# Patient Record
Sex: Male | Born: 1995 | Race: White | Hispanic: No | Marital: Single | Smoking: Current every day smoker
Health system: Southern US, Community
[De-identification: ages and names within clinical notes are randomized; demographics above are authoritative.]

## PROBLEM LIST (undated history)

## (undated) DIAGNOSIS — F419 Anxiety disorder, unspecified: Secondary | ICD-10-CM

## (undated) HISTORY — DX: Anxiety disorder, unspecified: F41.9

---

## 2020-01-19 ENCOUNTER — Other Ambulatory Visit: Payer: Self-pay

## 2020-01-19 ENCOUNTER — Ambulatory Visit (HOSPITAL_COMMUNITY)
Admission: AD | Admit: 2020-01-19 | Discharge: 2020-01-19 | Disposition: A | Discharge status: Home / Self Care | Payer: Self-pay | Attending: Psychiatry | Admitting: Psychiatry

## 2020-01-19 DIAGNOSIS — F1594 Other stimulant use, unspecified with stimulant-induced mood disorder: Secondary | ICD-10-CM | POA: Insufficient documentation

## 2020-01-19 DIAGNOSIS — F329 Major depressive disorder, single episode, unspecified: Secondary | ICD-10-CM | POA: Insufficient documentation

## 2020-01-19 DIAGNOSIS — F1290 Cannabis use, unspecified, uncomplicated: Secondary | ICD-10-CM | POA: Insufficient documentation

## 2020-01-19 DIAGNOSIS — Z79899 Other long term (current) drug therapy: Secondary | ICD-10-CM | POA: Insufficient documentation

## 2020-01-19 DIAGNOSIS — Z1330 Encounter for screening examination for mental health and behavioral disorders, unspecified: Secondary | ICD-10-CM | POA: Insufficient documentation

## 2020-01-19 DIAGNOSIS — F17210 Nicotine dependence, cigarettes, uncomplicated: Secondary | ICD-10-CM | POA: Insufficient documentation

## 2020-01-19 DIAGNOSIS — F419 Anxiety disorder, unspecified: Secondary | ICD-10-CM | POA: Insufficient documentation

## 2020-01-19 DIAGNOSIS — Z20822 Contact with and (suspected) exposure to covid-19: Secondary | ICD-10-CM | POA: Insufficient documentation

## 2020-01-19 NOTE — BH Assessment (Addendum)
Assessment Note  Connor Avila is an 24 y.o. male, who presents voluntary and accompanied by his brother to [facility]. Clinician completed the pt assessment alone. Before engaging in the assessment, clinician observed the pt talking to registration, having questions about HIPPA and if he has to worry about staff. Clinician asked the pt, "what brought you to the hospital?" Pt reported, he feels he is suffering from Schizophrenia, he feels like people are responding to his thoughts. Pt reported, this has been going on since July 25, 2019. Pt reported, he can look over and see someone and they will respond to him verbally. Pt reported, he thought it was because of COVID that people are now conversing through the mind. Pt reported, he feels people are responding to him negatively and he feels discouraged. Pt reported, when he hears people, responding to his thoughts telling him to get out and of here, just leave, that makes him sad.  Pt reported, he has been homeless since he was 18. Pt denies, SI, HI, self-injurious behaviors and access to weapons.   Pt reported, smoking a gram of marijuana per day. Pt reported, smoking a half pack of cigarettes, daily. Pt reported, a history of drug use. Pt reported, he is not currently linked to OPT resources (medication management and/or counseling.) Pt reported, a previous inpatient admission when he was 17 at [facility].   Pt presents alert with logical, coherent speech. Pt's eye contact was good. Pt's mood, affect was pleasant. Pt's thought process was coherent, relevant. Pt's judgement was partial. Pt was oriented x4. Pt's concentration was normal. Pt's insight and impulse control was fair. Pt reported, if discharged from [facility] he could contract for safety.   Diagnosis: Unspecified Psychosis.   Past Medical History: No past medical history on file.  Family History: No family history on file.  Social History:  has no history on file for tobacco use,  alcohol use, and drug use.  Additional Social History:  Alcohol / Drug Use Pain Medications: See MAR Prescriptions: See MAR Over the Counter: See MAR History of alcohol / drug use?: Yes Substance #1 Name of Substance 1: Marijuana. 1 - Age of First Use: UTA 1 - Amount (size/oz): Pt reported, smoking a gram of marijuana per day. 1 - Frequency: Daily. 1 - Duration: Ongoing. 1 - Last Use / Amount: Pt reported, today. Substance #2 Name of Substance 2: Cigarettes. 2 - Age of First Use: UTA 2 - Amount (size/oz): Pt reported, smoking a half pack, daily. 2 - Frequency: Daily. 2 - Duration: Ongoing. 2 - Last Use / Amount: Daily.  CIWA:   COWS:    Allergies: Not on File  Home Medications: (Not in a hospital admission)   OB/GYN Status:  No LMP for male patient.  General Assessment Data Location of Assessment:  [facility]. ) TTS Assessment: In system Is this a Tele or Face-to-Face Assessment?: Face-to-Face Is this an Initial Assessment or a Re-assessment for this encounter?: Initial Assessment Patient Accompanied by:: Other Merleen Nicely, brother. ) Language Other than English: No Living Arrangements: Homeless/Shelter (Staying with brother. ) What gender do you identify as?: Male Marital status: Single Living Arrangements: Other (Comment) (Homeless but staying with brother.) Can pt return to current living arrangement?: Yes Admission Status: Voluntary Is patient capable of signing voluntary admission?: Yes Referral Source: Self/Family/Friend Insurance type: BCBS.  Medical Screening Exam [facility] Walk-in ONLY) Medical Exam completed: Yes  Crisis Care Plan Living Arrangements: Other (Comment) (Homeless but staying with  brother.) Legal Guardian: Other: (Self. ) Name of Psychiatrist: None.  Name of Therapist: None.   Education Status Is patient currently in school?: No  Risk to self with the past 6 months Suicidal Ideation: No (Pt denies. ) Has patient been a risk to self  within the past 6 months prior to admission? : No Suicidal Intent: No Has patient had any suicidal intent within the past 6 months prior to admission? : No Is patient at risk for suicide?: No Suicidal Plan?: No Has patient had any suicidal plan within the past 6 months prior to admission? : No Access to Means: No What has been your use of drugs/alcohol within the last 12 months?: Marijuana and cigarettes.  Previous Attempts/Gestures: No How many times?: 0 Other Self Harm Risks: NA Triggers for Past Attempts: None known Intentional Self Injurious Behavior: None (Pt denies. ) Recent stressful life event(s): Other (Comment) (Loud cars. ) Persecutory voices/beliefs?: No Depression: Yes Depression Symptoms: Despondent, Tearfulness Substance abuse history and/or treatment for substance abuse?: Yes Suicide prevention information given to non-admitted patients: Not applicable  Risk to Others within the past 6 months Homicidal Ideation: No (Pt denies. ) Does patient have any lifetime risk of violence toward others beyond the six months prior to admission? : No Thoughts of Harm to Others: No Current Homicidal Intent: No Current Homicidal Plan: No Access to Homicidal Means: No Identified Victim: NA History of harm to others?: No Assessment of Violence: None Noted Violent Behavior Description: NA Does patient have access to weapons?: No Criminal Charges Pending?: No Does patient have a court date: No Is patient on probation?: No  Psychosis Hallucinations: Auditory Delusions: None noted  Mental Status Report Appearance/Hygiene: Unremarkable Eye Contact: Good Motor Activity: Unremarkable Speech: Logical/coherent Level of Consciousness: Alert Anxiety Level: Minimal Thought Processes: Coherent, Relevant Judgement: Partial Orientation: Person, Place, Time, Situation Obsessive Compulsive Thoughts/Behaviors: None  Cognitive Functioning Concentration: Normal Memory: Recent  Intact Is patient IDD: No Insight: Fair Impulse Control: Fair Appetite: Poor Sleep: Decreased Total Hours of Sleep: 6 Vegetative Symptoms: None  ADLScreening [facility] Assessment Services) Patient's cognitive ability adequate to safely complete daily activities?: Yes Patient able to express need for assistance with ADLs?: Yes Independently performs ADLs?: Yes (appropriate for developmental age)  Prior Inpatient Therapy Prior Inpatient Therapy: Yes Prior Therapy Dates: When pt was 24 years old.  Prior Therapy Facilty/Provider(s): [facility].  Reason for Treatment: Suicidal thoughs.   Prior Outpatient Therapy Prior Outpatient Therapy: No Does patient have an ACCT team?: No Does patient have Intensive In-House Services?  : No Does patient have [facility] services? : No Does patient have P4CC services?: No  ADL Screening (condition at time of admission) Patient's cognitive ability adequate to safely complete daily activities?: Yes Is the patient deaf or have difficulty hearing?: No Does the patient have difficulty seeing, even when wearing glasses/contacts?: No Does the patient have difficulty concentrating, remembering, or making decisions?: No Patient able to express need for assistance with ADLs?: Yes Does the patient have difficulty dressing or bathing?: No Independently performs ADLs?: Yes (appropriate for developmental age) Does the patient have difficulty walking or climbing stairs?: No Weakness of Legs: None Weakness of Arms/Hands: None  Home Assistive Devices/Equipment Home Assistive Devices/Equipment: None    Abuse/Neglect Assessment (Assessment to be complete while patient is alone) Abuse/Neglect Assessment Can Be Completed: Yes Physical Abuse: Denies Verbal Abuse:  (Mental abuse.) Sexual Abuse: Denies Exploitation of patient/patient's resources: Denies Self-Neglect: Denies     Merchant navy officer (For Healthcare)  Does Patient Have a Medical Advance  Directive?: No    Disposition: Nira Conn, NP recommends pt is linked to continuing observation unit. Disposition discussed with Leanne Chang, Registration and Randa Evens, RN at [facility] that pt is coming.    Disposition Initial Assessment Completed for this Encounter: Yes Disposition of Patient: Admit Type of inpatient treatment program:  (Continuing observation unit. ) Patient refused recommended treatment: No  On Site Evaluation by: Redmond Pulling, MS, [facility], CRC.  Reviewed with Physician: Nira Conn, NP.  Redmond Pulling 01/19/2020 11:38 PM   Redmond Pulling, MS, [facility], [facility] Triage Specialist [phone]

## 2020-01-19 NOTE — H&P (Signed)
Behavioral Health Medical Screening Exam  Connor Avila is an 24 y.o. male.  Transferred to [facility] for continuous assessment.  Total Time spent with patient: 15 minutes  Psychiatric Specialty Exam: Physical Exam Constitutional:      General: He is not in acute distress.    Appearance: He is not ill-appearing, toxic-appearing or diaphoretic.  Pulmonary:     Effort: Pulmonary effort is normal.  Musculoskeletal:        General: Normal range of motion.  Skin:    General: Skin is warm and dry.  Neurological:     Mental Status: He is alert and oriented to person, place, and time.    Review of Systems  Constitutional: Negative for activity change, appetite change, chills, diaphoresis, fatigue, fever and unexpected weight change.  HENT: Negative for congestion and sore throat.   Respiratory: Negative for cough.   Cardiovascular: Negative for chest pain.  Gastrointestinal: Negative for diarrhea, nausea and vomiting.  Neurological: Negative for dizziness and seizures.  Psychiatric/Behavioral: Positive for dysphoric mood. The patient is nervous/anxious.    There were no vitals taken for this visit.There is no height or weight on file to calculate BMI. General Appearance: Casual Eye Contact:  Good Speech:  Clear and Coherent and Normal Rate Volume:  Normal Mood:  Anxious and Depressed Affect:  Congruent Thought Process:  Coherent, Goal Directed and Linear Orientation:  Full (Time, Place, and Person) Thought Content:  Paranoid Ideation Suicidal Thoughts:  No Homicidal Thoughts:  No Memory:  Immediate;   Good Recent;   Good Judgement:  Fair Insight:  Fair Psychomotor Activity:  Normal Concentration: Concentration: Good and Attention Span: Good Recall:  Good Fund of Knowledge:Good Language: Good Akathisia:  Negative Handed:  Right AIMS (if indicated):    Assets:  Communication Skills Desire for Improvement Leisure Time Physical Health Resilience Sleep:      Musculoskeletal: Strength & Muscle Tone: within normal limits Gait & Station: normal Patient leans: N/A  There were no vitals taken for this visit.  Recommendations: Based on my evaluation the patient does not appear to have an emergency medical condition.  Jackelyn Poling, NP 01/19/2020, 11:40 PM

## 2020-01-20 ENCOUNTER — Other Ambulatory Visit: Payer: Self-pay

## 2020-01-20 ENCOUNTER — Encounter (HOSPITAL_COMMUNITY): Payer: Self-pay

## 2020-01-20 ENCOUNTER — Ambulatory Visit (HOSPITAL_COMMUNITY)
Admission: EM | Admit: 2020-01-20 | Discharge: 2020-01-20 | Disposition: A | Discharge status: Home / Self Care | Payer: No Payment, Other | Attending: Nurse Practitioner | Admitting: Nurse Practitioner

## 2020-01-20 DIAGNOSIS — F1594 Other stimulant use, unspecified with stimulant-induced mood disorder: Secondary | ICD-10-CM

## 2020-01-20 LAB — TSH: TSH: 1.252 u[IU]/mL (ref 0.350–4.500)

## 2020-01-20 LAB — CBC WITH DIFFERENTIAL/PLATELET
Abs Immature Granulocytes: 0.01 10*3/uL (ref 0.00–0.07)
Basophils Absolute: 0 10*3/uL (ref 0.0–0.1)
Basophils Relative: 0 %
Eosinophils Absolute: 0.2 10*3/uL (ref 0.0–0.5)
Eosinophils Relative: 2 %
HCT: 45.7 % (ref 39.0–52.0)
Hemoglobin: 16.1 g/dL (ref 13.0–17.0)
Immature Granulocytes: 0 %
Lymphocytes Relative: 35 %
Lymphs Abs: 2.5 10*3/uL (ref 0.7–4.0)
MCH: 30.6 pg (ref 26.0–34.0)
MCHC: 35.2 g/dL (ref 30.0–36.0)
MCV: 86.7 fL (ref 80.0–100.0)
Monocytes Absolute: 0.9 10*3/uL (ref 0.1–1.0)
Monocytes Relative: 12 %
Neutro Abs: 3.7 10*3/uL (ref 1.7–7.7)
Neutrophils Relative %: 51 %
Platelets: 163 10*3/uL (ref 150–400)
RBC: 5.27 MIL/uL (ref 4.22–5.81)
RDW: 12.2 % (ref 11.5–15.5)
WBC: 7.3 10*3/uL (ref 4.0–10.5)
nRBC: 0 % (ref 0.0–0.2)

## 2020-01-20 LAB — COMPREHENSIVE METABOLIC PANEL
ALT: 22 U/L (ref 0–44)
AST: 26 U/L (ref 15–41)
Albumin: 4.6 g/dL (ref 3.5–5.0)
Alkaline Phosphatase: 57 U/L (ref 38–126)
Anion gap: 13 (ref 5–15)
BUN: 22 mg/dL — ABNORMAL HIGH (ref 6–20)
CO2: 22 mmol/L (ref 22–32)
Calcium: 9.2 mg/dL (ref 8.9–10.3)
Chloride: 99 mmol/L (ref 98–111)
Creatinine, Ser: 1.3 mg/dL — ABNORMAL HIGH (ref 0.61–1.24)
GFR calc Af Amer: >60 mL/min (ref >60)
GFR calc non Af Amer: >60 mL/min (ref >60)
Glucose, Bld: 113 mg/dL — ABNORMAL HIGH (ref 70–99)
Potassium: 3.6 mmol/L (ref 3.5–5.1)
Sodium: 134 mmol/L — ABNORMAL LOW (ref 135–145)
Total Bilirubin: 1.4 mg/dL — ABNORMAL HIGH (ref 0.3–1.2)
Total Protein: 6.7 g/dL (ref 6.5–8.1)

## 2020-01-20 LAB — HEMOGLOBIN A1C
Hgb A1c MFr Bld: 5.4 % (ref 4.8–5.6)
Mean Plasma Glucose: 108.28 mg/dL

## 2020-01-20 LAB — POCT URINE DRUG SCREEN - MANUAL ENTRY (I-SCREEN)
POC Amphetamine UR: POSITIVE — AB
POC Buprenorphine (BUP): NOT DETECTED
POC Cocaine UR: NOT DETECTED
POC Marijuana UR: POSITIVE — AB
POC Methadone UR: NOT DETECTED
POC Methamphetamine UR: POSITIVE — AB
POC Morphine: NOT DETECTED
POC Oxazepam (BZO): NOT DETECTED
POC Oxycodone UR: NOT DETECTED
POC Secobarbital (BAR): NOT DETECTED

## 2020-01-20 LAB — LIPID PANEL
Cholesterol: 106 mg/dL (ref 0–200)
HDL: 37 mg/dL — ABNORMAL LOW (ref >40)
LDL Cholesterol: 54 mg/dL (ref 0–99)
Total CHOL/HDL Ratio: 2.9 RATIO
Triglycerides: 74 mg/dL (ref <150)
VLDL: 15 mg/dL (ref 0–40)

## 2020-01-20 LAB — POC SARS CORONAVIRUS 2 AG: SARS Coronavirus 2 Ag: NEGATIVE

## 2020-01-20 MED ORDER — ACETAMINOPHEN 325 MG PO TABS: 650.0000 mg | ORAL_TABLET | Freq: Four times a day (QID) | ORAL | Status: DC | PRN

## 2020-01-20 MED ORDER — MAGNESIUM HYDROXIDE 400 MG/5ML PO SUSP: 30.0000 mL | Freq: Every day | ORAL | Status: DC | PRN

## 2020-01-20 MED ORDER — HYDROXYZINE HCL 25 MG PO TABS: 25.0000 mg | ORAL_TABLET | Freq: Three times a day (TID) | ORAL | 0 refills | Status: AC | PRN

## 2020-01-20 MED ORDER — QUETIAPINE FUMARATE 100 MG PO TABS
100.0000 mg | ORAL_TABLET | Freq: Every day | ORAL | Status: DC
  Administered 2020-01-20: 100 mg via ORAL
  Filled 2020-01-20: qty 1

## 2020-01-20 MED ORDER — QUETIAPINE FUMARATE 100 MG PO TABS: 100.0000 mg | ORAL_TABLET | Freq: Every day | ORAL | 0 refills | Status: AC

## 2020-01-20 MED ORDER — TRAZODONE HCL 50 MG PO TABS: 50.0000 mg | ORAL_TABLET | Freq: Every evening | ORAL | Status: DC | PRN

## 2020-01-20 MED ORDER — ALUM & MAG HYDROXIDE-SIMETH 200-200-20 MG/5ML PO SUSP: 30.0000 mL | ORAL | Status: DC | PRN

## 2020-01-20 MED ORDER — HYDROXYZINE HCL 25 MG PO TABS
25.0000 mg | ORAL_TABLET | Freq: Three times a day (TID) | ORAL | Status: DC | PRN
  Administered 2020-01-20: 25 mg via ORAL
  Filled 2020-01-20: qty 1

## 2020-01-20 NOTE — Discharge Instructions (Addendum)
Substance abuse resources and Residential Options:  [facility] day residential substance abuse facility (not an option if you have active assault charges). [address], [place], [place] [address] Phone: [phone]: Ask for Shayla in admissions to complete intake if interested in pursuing this option.  [facility] (For uninsured/[place] Medicaid patients only): Can get intake scheduled; (not an option if you have active assault charges). [address] [place], [place] ([phone]) Call Mon-Fri.  [facility]): (offers outpatient therapy and intensive outpatient substance abuse therapy).  [address], [place], [place] [address] Phone: [phone]  [facility]: Offers FREE recovery skills classes, support groups, 1:1 Peer Support, and Compeer Classes. [address], [place], [place] [address] Phone: [phone] (Call to complete intake).   [facility] [address] [place], [place] [address] Phone: [phone] ext [phone]  [facility] provides food, shelter and other programs and services to the homeless men of [place] through our men's program.  By offering safe shelter, three meals a day, clean clothing, Biblical counseling, financial planning, vocational training, GED/education and employment assistance, we've helped mend the shattered lives of many homeless men since opening in 1974.  We have approximately 267 beds available, with a max of 312 beds including mats for emergency situations and currently house an average of 270 men a night.  Prospective Client Check-In Information Photo ID Required (State/ Out of State/ DOC) - if photo ID is not available, clients are required to have a printout of a police/sheriff's criminal history report. Help out with chores around the Mission. No sex offender of any type (pending, charged, registered and/or any other sex  related offenses) will be permitted to check in. Must be willing to abide by all rules, regulations, and policies established by the [facility]. The following will be provided - shelter, food, clothing, and biblical counseling. If you or someone you know is in need of assistance at our men's shelter in [place], [place], please call [phone] ext. [phone].   [facility] Information:   [facility]: Call Tee Gray for information/admission: [phone] Recovery homes are located in the [place] area/on the bus line. There is an admission fee and weekly rent fee. Please speak with Mr. Gray regarding the specifics.  [facility]:   For house vacancies and contacts, please visit: www.[email or web address].com  Basic House Rules   Houses operate democratically, electing house officers who serve six-month terms.   Houses are financially self-supporting; members split house expenses, which average  $110.00 to $140.00 per person per week.   Any Resident who has a recurrence of use of alcohol and/or illicit drugs must be immediately expelled.   [facility] [address] , [place] [address] PHONE: [phone] Hours: Monday-Friday 8:00AM-3:00PM  This agency offers the following services for free:   *Integrated Care -Case management -[address] Outreach -Medical clinic -Mental health nurse -Referrals  *Fundamental Services -Showers and hygiene supplies -Laundry -Phone access -Mailing addresses and mailboxes -Replacement IDs -Onsite barbershop -Storage lockers -[place] winter warming center  *Self Sufficiency -Skilled trade classes -Job skills classes -Resume and jobs application assistance -Interview training -GED classes -Professional clothing vouchers -Financial literacy  Take all of your medications as prescribed by your mental healthcare provider.   Report any adverse effects and reactions from your medications to your outpatient provider promptly.  Do not engage in alcohol and or   illegal drug use while on prescription medicines. Keep all scheduled appointments. This is to ensure that you are getting refills on time and to avoid any interruption in your medication.  If you are unable to keep an appointment call to reschedule.  Be sure to follow up with resources and follow ups given. In the event of worsening symptoms call the crisis hotline, 911, and or go to the nearest emergency department for appropriate evaluation and treatment of symptoms. Follow-up with your primary care provider for your medical issues, concerns and or health care needs.   

## 2020-01-20 NOTE — ED Provider Notes (Signed)
Behavioral Health Admission H&P [facility] & OBS)  Date: 01/20/20 Patient Name: Connor Avila MRN: [id number] Chief Complaint:  Chief Complaint  Patient presents with  . Anxiety      Diagnoses:  Final diagnoses:  Methamphetamine-induced mood disorder (HCC)    HPI: Mikey Maffett is an 24 y.o. male who presented voluntarily to [facility] as a walk-in.  Patient reported feelings of paranoia.  Reported that he feels like people are hearing and responding to his thoughts.  Stated that he feels like people are talking negatively about him.  He was transferred to behavioral health urgent care for continuous assessment.  Upon arriving to [facility], patient asked why is he being held here against his will.  Discussed with him that he is here voluntarily.  He requested to leave.  He denied SI, HI, and AVH; did not meet IVC criteria; and was told that he could discharge with outpatient resources. However, before leaving the building he changed his mind.  He apologized for being "difficult".  On evaluation, patient is alert and oriented x4.  He is irritable, but cooperative.  His speech is clear and coherent.  He reports feelings of paranoia and irritability. Reports that he feels like people can hear his thoughts and are responding negatively.  Patient denies auditory and visual hallucinations.  He does report that he had auditory and visual hallucinations on January 19 of this year after smoking marijuana.  He does not appear to be responding to internal stimuli.  He reports that he uses marijuana daily.  He reports that he smokes methamphetamine, reports last use was about a week ago.  Denies use of other substances.  Urine drug screen was positive for methamphetamine, amphetamines, and marijuana.    Patient initially declined to have any blood drawn.  He stated "I want my blood to stay inside of me."   Patient reported that the only medications he is taken in the past is hydroxyzine and propranolol, both for anxiety.   When discussing the possibility of starting Seroquel for paranoia and irritability, patient stated that he has taken Seroquel 300 mg nightly in the past.  He states that he tolerated Seroquel well other than daytime drowsiness.  He is in agreement with starting Seroquel at a lower dose.   PHQ 2-9:     Total Time spent with patient: 45 minutes  Musculoskeletal  Strength & Muscle Tone: within normal limits Gait & Station: normal Patient leans: N/A  Psychiatric Specialty Exam  Presentation General Appearance: Appropriate for Environment;Casual  Eye Contact:Good  Speech:Clear and Coherent;Normal Rate  Speech Volume:Normal  Handedness:No data recorded  Mood and Affect  Mood:Irritable  Affect:Congruent   Thought Process  Thought Processes:Coherent;Linear  Descriptions of Associations:Intact  Orientation:Full (Time, Place and Person)  Thought Content:Paranoid Ideation  Hallucinations:Hallucinations: None  Ideas of Reference:None  Suicidal Thoughts:Suicidal Thoughts: No  Homicidal Thoughts:Homicidal Thoughts: No   Sensorium  Memory:Immediate Good;Recent Good  Judgment:Fair  Insight:Fair   Executive Functions  Concentration:Good  Attention Span:Good  Recall:Good  Fund of Knowledge:Good  Language:Good   Psychomotor Activity  Psychomotor Activity:Psychomotor Activity: Normal   Assets  Assets:Communication Skills;Desire for Improvement;Housing;Leisure Time;Physical Health;Resilience   Sleep  Sleep:Sleep: Fair   Physical Exam Vitals and nursing note reviewed.  Constitutional:      General: He is not in acute distress.    Appearance: He is not ill-appearing, toxic-appearing or diaphoretic.  HENT:     Head: Normocephalic.     Right Ear: External ear normal.  Left Ear: External ear normal.  Eyes:     Pupils: Pupils are equal, round, and reactive to light.  Cardiovascular:     Rate and Rhythm: Normal rate.  Pulmonary:     Effort:  Pulmonary effort is normal. No respiratory distress.  Neurological:     Mental Status: He is alert and oriented to person, place, and time.  Psychiatric:        Mood and Affect: Mood is anxious and depressed.        Thought Content: Thought content is paranoid. Thought content does not include homicidal or suicidal ideation.    Review of Systems  Constitutional: Negative for chills, diaphoresis, fever, malaise/fatigue and weight loss.  Respiratory: Negative for cough and shortness of breath.   Cardiovascular: Negative for chest pain.  Gastrointestinal: Negative for diarrhea, nausea and vomiting.  Neurological: Negative for dizziness and seizures.  Psychiatric/Behavioral: Positive for depression and substance abuse. Negative for hallucinations, memory loss and suicidal ideas. The patient is nervous/anxious and has insomnia.     Blood pressure 114/82, pulse 60, temperature (!) 97.5 F (36.4 C), temperature source Tympanic, resp. rate 18, height  (2.032 m), weight 180 lb (81.6 kg), SpO2 100 %. Body mass index is 19.77 kg/m.  Past Psychiatric History: Methamphetamine Use Disorder, Marijuana Use. Reports inpatient admission at the age of 89 at [facility] due to depression.  Is the patient at risk to self? No  Has the patient been a risk to self in the past 6 months? No .    Has the patient been a risk to self within the distant past? No   Is the patient a risk to others? No   Has the patient been a risk to others in the past 6 months? No   Has the patient been a risk to others within the distant past? No   Past Medical History:  Past Medical History:  Diagnosis Date  . Anxiety    History reviewed. No pertinent surgical history.  Family History: No family history on file.  Social History:  Social History   Socioeconomic History  . Marital status: Single    Spouse name: Not on file  . Number of children: Not on file  . Years of education: Not on file  . Highest education  level: Not on file  Occupational History  . Not on file  Tobacco Use  . Smoking status: Current Every Day Smoker    Packs/day: 0.50  Substance and Sexual Activity  . Alcohol use: Not on file  . Drug use: Yes    Types: Amphetamines, Marijuana, Methamphetamines    Comment: daily  . Sexual activity: Not Currently  Other Topics Concern  . Not on file  Social History Narrative  . Not on file   Social Determinants of Health   Financial Resource Strain:   . Difficulty of Paying Living Expenses:   Food Insecurity:   . Worried About Programme researcher, broadcasting/film/video in the Last Year:   . Barista in the Last Year:   Transportation Needs:   . Freight forwarder (Medical):   Marland Kitchen Lack of Transportation (Non-Medical):   Physical Activity:   . Days of Exercise per Week:   . Minutes of Exercise per Session:   Stress:   . Feeling of Stress :   Social Connections:   . Frequency of Communication with Friends and Family:   . Frequency of Social Gatherings with Friends and Family:   . Attends  Religious Services:   . Active Member of Clubs or Organizations:   . Attends BankerClub or Organization Meetings:   Marland Kitchen. Marital Status:   Intimate Partner Violence:   . Fear of Current or Ex-Partner:   . Emotionally Abused:   Marland Kitchen. Physically Abused:   . Sexually Abused:     SDOH:  SDOH Screenings   Alcohol Screen:   . Last Alcohol Screening Score (AUDIT):   Depression (PHQ2-9):   . PHQ-2 Score:   Financial Resource Strain:   . Difficulty of Paying Living Expenses:   Food Insecurity:   . Worried About Programme researcher, broadcasting/film/videounning Out of Food in the Last Year:   . [employer] Out of Food in the Last Year:   Housing:   . Last Housing Risk Score:   Physical Activity:   . Days of Exercise per Week:   . Minutes of Exercise per Session:   Social Connections:   . Frequency of Communication with Friends and Family:   . Frequency of Social Gatherings with Friends and Family:   . Attends Religious Services:   . Active Member of Clubs or  Organizations:   . Attends BankerClub or Organization Meetings:   Marland Kitchen. Marital Status:   Stress:   . Feeling of Stress :   Tobacco Use: High Risk  . Smoking Tobacco Use: Current Every Day Smoker  . Smokeless Tobacco Use: Unknown  Transportation Needs:   . Lack of Transportation (Medical):   Marland Kitchen. Lack of Transportation (Non-Medical):     Last Labs:  Admission on 01/20/2020  Component Date Value Ref Range Status  . SARS Coronavirus 2 Ag 01/20/2020 NEGATIVE  NEGATIVE Final   Comment: (NOTE) SARS-CoV-2 antigen NOT DETECTED.   Negative results are presumptive.  Negative results do not preclude SARS-CoV-2 infection and should not be used as the sole basis for treatment or other patient management decisions, including infection  control decisions, particularly in the presence of clinical signs and  symptoms consistent with COVID-19, or in those who have been in contact with the virus.  Negative results must be combined with clinical observations, patient history, and epidemiological information. The expected result is Negative.  Fact Sheet for Patients: https://sanders-williams.net/https://www.fda.gov/media/139754/download  Fact Sheet for Healthcare Providers: https://martinez.com/https://www.fda.gov/media/139753/download   This test is not yet approved or cleared by the [place] FDA and  has been authorized for detection and/or diagnosis of SARS-CoV-2 by FDA under an Emergency Use Authorization (EUA).  This EUA will remain in effect (meaning this test can be used) for the duration of  the C                          OVID-19 declaration under Section 564(b)(1) of the Act, 21 U.S.C. section 360bbb-3(b)(1), unless the authorization is terminated or revoked sooner.    . WBC 01/20/2020 7.3  4.0 - 10.5 K/uL Final  . RBC 01/20/2020 5.27  4.22 - 5.81 MIL/uL Final  . Hemoglobin 01/20/2020 16.1  13.0 - 17.0 g/dL Final  . HCT 16/10/960407/17/2021 45.7  39 - 52 % Final  . MCV 01/20/2020 86.7  80.0 - 100.0 fL Final  . MCH 01/20/2020 30.6  26.0 - 34.0  pg Final  . MCHC 01/20/2020 35.2  30.0 - 36.0 g/dL Final  . RDW 54/09/811907/17/2021 12.2  11.5 - 15.5 % Final  . Platelets 01/20/2020 163  150 - 400 K/uL Final  . nRBC 01/20/2020 0.0  0.0 - 0.2 % Final  . Neutrophils Relative % 01/20/2020 51  %  Final  . Neutro Abs 01/20/2020 3.7  1.7 - 7.7 K/uL Final  . Lymphocytes Relative 01/20/2020 35  % Final  . Lymphs Abs 01/20/2020 2.5  0.7 - 4.0 K/uL Final  . Monocytes Relative 01/20/2020 12  % Final  . Monocytes Absolute 01/20/2020 0.9  0 - 1 K/uL Final  . Eosinophils Relative 01/20/2020 2  % Final  . Eosinophils Absolute 01/20/2020 0.2  0 - 0 K/uL Final  . Basophils Relative 01/20/2020 0  % Final  . Basophils Absolute 01/20/2020 0.0  0 - 0 K/uL Final  . Immature Granulocytes 01/20/2020 0  % Final  . Abs Immature Granulocytes 01/20/2020 0.01  0.00 - 0.07 K/uL Final   Performed at [facility], [address] [place], [place] [address]  . Sodium 01/20/2020 134* 135 - 145 mmol/L Final  . Potassium 01/20/2020 3.6  3.5 - 5.1 mmol/L Final  . Chloride 01/20/2020 99  98 - 111 mmol/L Final  . CO2 01/20/2020 22  22 - 32 mmol/L Final  . Glucose, Bld 01/20/2020 113* 70 - 99 mg/dL Final   Glucose reference range applies only to samples taken after fasting for at least 8 hours.  . BUN 01/20/2020 22* 6 - 20 mg/dL Final  . Creatinine, Ser 01/20/2020 1.30* 0.61 - 1.24 mg/dL Final  . Calcium 09/62/8366 9.2  8.9 - 10.3 mg/dL Final  . Total Protein 01/20/2020 6.7  6.5 - 8.1 g/dL Final  . Albumin 29/47/6546 4.6  3.5 - 5.0 g/dL Final  . AST 50/35/4656 26  15 - 41 U/L Final  . ALT 01/20/2020 22  0 - 44 U/L Final  . Alkaline Phosphatase 01/20/2020 57  38 - 126 U/L Final  . Total Bilirubin 01/20/2020 1.4* 0.3 - 1.2 mg/dL Final  . GFR calc non Af Amer 01/20/2020 >60  >60 mL/min Final  . GFR calc Af Amer 01/20/2020 >60  >60 mL/min Final  . Anion gap 01/20/2020 13  5 - 15 Final   Performed at [facility], [address] [place], [place] [address]  . Hgb A1c  MFr Bld 01/20/2020 5.4  4.8 - 5.6 % Final   Comment: (NOTE) Pre diabetes:          5.7%-6.4%  Diabetes:              >6.4%  Glycemic control for   <7.0% adults with diabetes   . Mean Plasma Glucose 01/20/2020 108.28  mg/dL Final   Performed at [facility], [address], [place], [place] [address]  . Cholesterol 01/20/2020 106  0 - 200 mg/dL Final  . Triglycerides 01/20/2020 74  <150 mg/dL Final  . HDL 74/94/4967 37* >40 mg/dL Final  . Total CHOL/HDL Ratio 01/20/2020 2.9  RATIO Final  . VLDL 01/20/2020 15  0 - 40 mg/dL Final  . LDL Cholesterol 01/20/2020 54  0 - 99 mg/dL Final   Comment:        Total Cholesterol/HDL:CHD Risk Coronary Heart Disease Risk Table                     Men   Women  1/2 Average Risk   3.4   3.3  Average Risk       5.0   4.4  2 X Average Risk   9.6   7.1  3 X Average Risk  23.4   11.0        Use the calculated Patient Ratio above and  the CHD Risk Table to determine the patient's CHD Risk.        ATP III CLASSIFICATION (LDL):  <100     mg/dL   Optimal  160-737  mg/dL   Near or Above                    Optimal  130-159  mg/dL   Borderline  106-269  mg/dL   High  >485     mg/dL   Very High Performed at [facility], [address] [place], [place] [address]   . TSH 01/20/2020 1.252  0.350 - 4.500 uIU/mL Final   Comment: Performed by a 3rd Generation assay with a functional sensitivity of <=0.01 uIU/mL. Performed at [facility], [address], [place], [place] [address]   . POC Amphetamine UR 01/20/2020 Positive* None Detected Final  . POC Secobarbital (BAR) 01/20/2020 None Detected  None Detected Final  . POC Buprenorphine (BUP) 01/20/2020 None Detected  None Detected Final  . POC Oxazepam (BZO) 01/20/2020 None Detected  None Detected Final  . POC Cocaine UR 01/20/2020 None Detected  None Detected Final  . POC Methamphetamine UR 01/20/2020 Positive* None Detected Final  . POC Morphine 01/20/2020 None Detected  None Detected  Final  . POC Oxycodone UR 01/20/2020 None Detected  None Detected Final  . POC Methadone UR 01/20/2020 None Detected  None Detected Final  . POC Marijuana UR 01/20/2020 Positive* None Detected Final    Allergies: Patient has no allergy information on record.  PTA Medications: (Not in a hospital admission)   Medical Decision Making  Sodium 134, BUN 22, creatinine 1.30.  Patient is likely dehydrated.  Encouraged to drink Gatorade and fluids. QTC-427 Lipids, TSH, A1c, and CBC are essentially normal.  Start Seroquel 100 mg QHS for mood stability, paranoia Hydroxyzine 25 mg TID prn for anxiety    Recommendations  Based on my evaluation the patient does not appear to have an emergency medical condition.   Patient will be placed in the continuous assessment area at [facility] for treatment and stabilization. He will be reevaluated on 01/20/2020. The treatment team will determine disposition at that time.    Jackelyn Poling, NP 01/20/20  5:17 AM

## 2020-01-20 NOTE — ED Provider Notes (Signed)
FBC/OBS ASAP Discharge Summary  Date and Time: 01/20/2020 10:57 AM  Name: Connor Avila  MRN:  [id number]   Discharge Diagnoses:  Final diagnoses:  Methamphetamine-induced mood disorder (HCC)    Subjective: Patient states "I just want to find a halfway house to make money at in order to get ahead in life, I need the proper resources to do so."  Stay Summary: Patient assessed by nurse practitioner.  Patient alert and oriented, participates in assessment with irritable affect. Patient presented for voluntary walk-in assessment around midnight.  Currently patient admits to polysubstance use disorder including, marijuana, cocaine, methamphetamines, LSD and mushrooms.  Patient states "I have a substance abuse problem."  Patient reports "last substance use treatment approximately 2 years ago at [facility] where he completed 19 days. Patient reports "a condition where people are hearing my thoughts since July 25, 2019."  Patient believes this may have been triggered by marijuana use in January.  Patient denies command hallucinations. Patient denies suicidal and homicidal ideations.  Patient denies self-harm behaviors, denies history of suicide attempts.  Patient denies auditory visual hallucinations.  Patient denies symptoms of paranoia.  Patient does not appear to be responding to internal stimuli. Patient reports he is currently homeless in [place].  Patient reports most recently "staying in my car since I came to [place] from [place] in March 2021."  Patient reports residing in an [place] house in [place] prior to relocating to [facility].  Patient reports he has family and friends in the [place] area. Patient denies access to weapons.  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  Patient reports he has a history of depression and anxiety.  Patient denies current outpatient psychiatric provider, denies any history of medications to address mood.   Patient reports he would be willing to follow-up with outpatient psychiatry here at [facility].  Patient reports concerns that he is currently uninsured.  Patient offered support and encouragement. Patient denies any person to reach for collateral information.   Total Time spent with patient: 30 minutes  Past Psychiatric History: Anxiety, depression, polysubstance use disorder Past Medical History:  Past Medical History:  Diagnosis Date  . Anxiety    History reviewed. No pertinent surgical history. Family History: History reviewed. No pertinent family history. Family Psychiatric History: Mother-anxiety, depression Social History:  Social History   Substance and Sexual Activity  Alcohol Use None     Social History   Substance and Sexual Activity  Drug Use Yes  . Types: Amphetamines, Marijuana, Methamphetamines   Comment: daily    Social History   Socioeconomic History  . Marital status: Single    Spouse name: Not on file  . Number of children: Not on file  . Years of education: Not on file  . Highest education level: Not on file  Occupational History  . Not on file  Tobacco Use  . Smoking status: Current Every Day Smoker    Packs/day: 0.50    Types: Cigarettes  . Smokeless tobacco: Never Used  Substance and Sexual Activity  . Alcohol use: Not on file  . Drug use: Yes    Types: Amphetamines, Marijuana, Methamphetamines    Comment: daily  . Sexual activity: Not Currently  Other Topics Concern  . Not on file  Social History Narrative  . Not on file   Social Determinants of Health   Financial Resource Strain:   . Difficulty of Paying Living Expenses:   Food Insecurity:   .  Worried About Programme researcher, broadcasting/film/video in the Last Year:   . Barista in the Last Year:   Transportation Needs:   . Freight forwarder (Medical):   Marland Kitchen Lack of Transportation (Non-Medical):   Physical Activity:   . Days of Exercise per Week:   . Minutes  of Exercise per Session:   Stress:   . Feeling of Stress :   Social Connections:   . Frequency of Communication with Friends and Family:   . Frequency of Social Gatherings with Friends and Family:   . Attends Religious Services:   . Active Member of Clubs or Organizations:   . Attends Banker Meetings:   Marland Kitchen Marital Status:    SDOH:  SDOH Screenings   Alcohol Screen:   . Last Alcohol Screening Score (AUDIT):   Depression (PHQ2-9):   . PHQ-2 Score:   Financial Resource Strain:   . Difficulty of Paying Living Expenses:   Food Insecurity:   . Worried About Programme researcher, broadcasting/film/video in the Last Year:   . [employer] of Food in the Last Year:   Housing:   . Last Housing Risk Score:   Physical Activity:   . Days of Exercise per Week:   . Minutes of Exercise per Session:   Social Connections:   . Frequency of Communication with Friends and Family:   . Frequency of Social Gatherings with Friends and Family:   . Attends Religious Services:   . Active Member of Clubs or Organizations:   . Attends Banker Meetings:   Marland Kitchen Marital Status:   Stress:   . Feeling of Stress :   Tobacco Use: High Risk  . Smoking Tobacco Use: Current Every Day Smoker  . Smokeless Tobacco Use: Never Used  Transportation Needs:   . Freight forwarder (Medical):   Marland Kitchen Lack of Transportation (Non-Medical):     Has this patient used any form of tobacco in the last 30 days? (Cigarettes, Smokeless Tobacco, Cigars, and/or Pipes) A prescription for an FDA-approved tobacco cessation medication was offered at discharge and the patient refused  Current Medications:  Current Facility-Administered Medications  Medication Dose Route Frequency Provider Last Rate Last Admin  . acetaminophen (TYLENOL) tablet 650 mg  650 mg Oral Q6H PRN Nira Conn A, NP      . alum & mag hydroxide-simeth (MAALOX/MYLANTA) 200-200-20 MG/5ML suspension 30 mL  30 mL Oral Q4H PRN Nira Conn A, NP      . hydrOXYzine  (ATARAX/VISTARIL) tablet 25 mg  25 mg Oral TID PRN Nira Conn A, NP   25 mg at 01/20/20 0108  . magnesium hydroxide (MILK OF MAGNESIA) suspension 30 mL  30 mL Oral Daily PRN Nira Conn A, NP      . QUEtiapine (SEROQUEL) tablet 100 mg  100 mg Oral QHS Nira Conn A, NP   100 mg at 01/20/20 0109   No current outpatient medications on file.    PTA Medications: (Not in a hospital admission)   Musculoskeletal  Strength & Muscle Tone: within normal limits Gait & Station: normal Patient leans: N/A  Psychiatric Specialty Exam  Presentation  General Appearance: Appropriate for Environment;Casual  Eye Contact:Good  Speech:Clear and Coherent;Normal Rate  Speech Volume:Normal  Handedness:No data recorded  Mood and Affect  Mood:Irritable  Affect:Congruent   Thought Process  Thought Processes:Coherent;Linear  Descriptions of Associations:Intact  Orientation:Full (Time, Place and Person)  Thought Content:Paranoid Ideation  Hallucinations:Hallucinations: None  Ideas of Reference:None  Suicidal  Thoughts:Suicidal Thoughts: No  Homicidal Thoughts:Homicidal Thoughts: No   Sensorium  Memory:Immediate Good;Recent Good  Judgment:Fair  Insight:Fair   Executive Functions  Concentration:Good  Attention Span:Good  Recall:Good  Fund of Knowledge:Good  Language:Good   Psychomotor Activity  Psychomotor Activity:Psychomotor Activity: Normal   Assets  Assets:Communication Skills;Desire for Improvement;Housing;Leisure Time;Physical Health;Resilience   Sleep  Sleep:Sleep: Fair   Physical Exam  Physical Exam Vitals and nursing note reviewed.  Constitutional:      Appearance: He is well-developed.  HENT:     Head: Normocephalic.  Cardiovascular:     Rate and Rhythm: Normal rate.  Pulmonary:     Effort: Pulmonary effort is normal.  Neurological:     Mental Status: He is alert and oriented to person, place, and time.  Psychiatric:        Attention and  Perception: Attention and perception normal.        Mood and Affect: Mood normal. Affect is labile.        Speech: Speech normal.        Behavior: Behavior normal. Behavior is cooperative.        Thought Content: Thought content normal.        Cognition and Memory: Cognition normal.        Judgment: Judgment normal.    Review of Systems  Constitutional: Negative.   HENT: Negative.   Eyes: Negative.   Respiratory: Negative.   Cardiovascular: Negative.   Gastrointestinal: Negative.   Genitourinary: Negative.   Musculoskeletal: Negative.   Skin: Negative.   Neurological: Negative.   Endo/Heme/Allergies: Negative.   Psychiatric/Behavioral: Positive for substance abuse.   Blood pressure 107/74, pulse 92, temperature 97.8 F (36.6 C), temperature source Tympanic, resp. rate 18, height 6\' 8"  (2.032 m), weight 180 lb (81.6 kg), SpO2 100 %. Body mass index is 19.77 kg/m.  Demographic Factors:  Male and Caucasian  Loss Factors: NA  Historical Factors: NA  Risk Reduction Factors:   Employed, Positive social support, Positive therapeutic relationship and Positive coping skills or problem solving skills  Continued Clinical Symptoms:  Alcohol/Substance Abuse/Dependencies  Cognitive Features That Contribute To Risk:  None    Suicide Risk:  Minimal: No identifiable suicidal ideation.  Patients presenting with no risk factors but with morbid ruminations; may be classified as minimal risk based on the severity of the depressive symptoms  Plan Of Care/Follow-up recommendations:  Patient reviewed with Dr. . Peers support consult initiated. Other:  Follow-up with outpatient substance use resources, housing resources, and outpatient psychiatric provider resources.   Discharge medications include: Seroquel 100 mg nightly/delusion Vistaril 25 mg 3 times daily as needed/anxiety  Disposition: Discharge  Jannifer Franklin, FNP 01/20/2020, 10:57 AM

## 2020-01-20 NOTE — ED Notes (Signed)
Pt belongings  Stored in locker 1 are phone, phone charger, belt, a cup, and a string from his sweater.
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
## 2020-01-20 NOTE — ED Notes (Signed)
Patient discharged home. AVS reviewed with patient and he is aware to pick prescriptions up from pharmacy. Written copy of AVS given to patient. Patient took a shower prior to discharge. Patient called friend to pick him up.Patient escorted to lobby to wait for friend to come pick him up.

## 2020-01-20 NOTE — Progress Notes (Addendum)
Pt was provided with the following mental health/substance abuse resources in his After Visit Summary:  Substance abuse resources and Residential Options:  [facility] day residential substance abuse facility (not an option if you have active assault charges). [address], [place], [place] [address] Phone: [phone]: Ask for Drema Pry in admissions to complete intake if interested in pursuing this option.  [facility] (For uninsured/[place] Medicaid patients only): Can get intake scheduled; (not an option if you have active assault charges). [address] [place], [place] [phone]) Call Mon-Fri.  [facility]): (offers outpatient therapy and intensive outpatient substance abuse therapy).  [address], [place], [place] [address] Phone: [phone]  [facility]: Offers FREE recovery skills classes, support groups, 1:1 Peer Support, and Compeer Classes. [address], [place], [place] [address] Phone: [phone] (Call to complete intake).   [facility] [address] [place], [place] [address] Phone: [phone] ext [phone]  The [facility] provides food, shelter and other programs and services to the homeless men of [place] through our [employer] program.  By offering safe shelter, three meals a day, clean clothing, Biblical counseling, financial planning, vocational training, GED/education and employment assistance, we've helped mend the shattered lives of many homeless men since opening in 1974.  We have approximately 267 beds available, with a max of 312 beds including mats for emergency situations and currently house an average of 270 men a night.  Prospective Client Check-In Information Photo ID Required (State/ Out of State/ [facility]) - if photo ID is not available, clients are required to have a printout of a police/sheriff's criminal history report. Help out with chores  around the [facility]. No sex offender of any type (pending, charged, registered and/or any other sex related offenses) will be permitted to check in. Must be willing to abide by all rules, regulations, and policies established by the [employer]. The following will be provided - shelter, food, clothing, and biblical counseling. If you or someone you know is in need of assistance at our [facility] shelter in [place], [place], please call [phone] ext. [phone].   [facility] Information:   [facility]: Call Winferd Humphrey for information/admission: [phone] Recovery homes are located in the [place] area/on the bus line. There is an admission fee and weekly rent fee. Please speak with Mr. Wallace Cullens regarding the specifics.  [facility]:   For house vacancies and contacts, please visit: www.[email or web address].com  [employer] operate Database administrator, electing house officers who serve six-month terms.   Houses are financially self-supporting; members split house expenses, which average  $110.00 to $140.00 per person per week.   Any Resident who has a recurrence of use of alcohol and/or illicit drugs must be immediately expelled.   [facility] [address] [place], [place] [address] PHONE: [phone] Hours: Monday-Friday 8:00AM-3:00PM  This agency offers the following services for free:   *Integrated Care -Case management -[facility] [address] [employer] -Medical clinic -Mental health nurse -Referrals  *Fundamental Services -Showers and hygiene supplies -Laundry -Phone access -Mailing addresses and mailboxes -Replacement IDs -Onsite barbershop -Storage lockers -[employer] winter warming center  *Engineer, drilling -Skilled trade classes -Job skills classes -Resume and jobs application assistance -Interview training -GED classes -Audiological scientist  vouchers -Conservation officer, nature S. Alan Ripper, MSW, LCSW Clinical Social Worker 01/20/2020 9:49 AM

## 2020-01-20 NOTE — ED Notes (Signed)
Pt. refused to stay, received his item back before being assigned to a locker. Tech walked pt out, after a conversation, pt returns with tech to the unit and agrees to stay to complete his psych eval.

## 2020-01-20 NOTE — Progress Notes (Signed)
He continued to sleep throughout the night without incident.

## 2020-01-20 NOTE — Progress Notes (Signed)
Pt was compliant with his medication, given eye blinders and drifted off to sleep.

## 2020-01-20 NOTE — ED Notes (Signed)
Patient is alert, oriented and ambulatory. Patient denies SI/HI. Patient states he came for a mental health evaluation. Patient did not want to go into details just states he wants to talk with a doctor.  Patient provided support and encouragement. Monitoring continues.

## 2020-11-26 ENCOUNTER — Other Ambulatory Visit: Payer: Self-pay

## 2020-11-26 ENCOUNTER — Emergency Department: Payer: Self-pay

## 2020-11-26 ENCOUNTER — Encounter: Payer: Self-pay | Admitting: Emergency Medicine

## 2020-11-26 ENCOUNTER — Emergency Department
Admission: EM | Admit: 2020-11-26 | Discharge: 2020-11-26 | Disposition: A | Discharge status: Home / Self Care | Payer: Self-pay | Attending: Emergency Medicine | Admitting: Emergency Medicine

## 2020-11-26 DIAGNOSIS — S0181XA Laceration without foreign body of other part of head, initial encounter: Secondary | ICD-10-CM | POA: Insufficient documentation

## 2020-11-26 DIAGNOSIS — S60212A Contusion of left wrist, initial encounter: Secondary | ICD-10-CM | POA: Insufficient documentation

## 2020-11-26 DIAGNOSIS — S51011A Laceration without foreign body of right elbow, initial encounter: Secondary | ICD-10-CM | POA: Insufficient documentation

## 2020-11-26 DIAGNOSIS — Z21 Asymptomatic human immunodeficiency virus [HIV] infection status: Secondary | ICD-10-CM

## 2020-11-26 DIAGNOSIS — S60221A Contusion of right hand, initial encounter: Secondary | ICD-10-CM | POA: Insufficient documentation

## 2020-11-26 DIAGNOSIS — B20 Human immunodeficiency virus [HIV] disease: Secondary | ICD-10-CM | POA: Insufficient documentation

## 2020-11-26 DIAGNOSIS — F17210 Nicotine dependence, cigarettes, uncomplicated: Secondary | ICD-10-CM | POA: Insufficient documentation

## 2020-11-26 DIAGNOSIS — Y30XXXA Falling, jumping or pushed from a high place, undetermined intent, initial encounter: Secondary | ICD-10-CM | POA: Insufficient documentation

## 2020-11-26 DIAGNOSIS — S2232XA Fracture of one rib, left side, initial encounter for closed fracture: Secondary | ICD-10-CM | POA: Insufficient documentation

## 2020-11-26 LAB — HEPATITIS B SURFACE ANTIGEN: Hepatitis B Surface Ag: NONREACTIVE

## 2020-11-26 LAB — RAPID HIV SCREEN (HIV 1/2 AB+AG)
HIV 1/2 Antibodies: REACTIVE — AB
HIV-1 P24 Antigen - HIV24: NONREACTIVE

## 2020-11-26 MED ORDER — HYDROCODONE-ACETAMINOPHEN 5-325 MG PO TABS
1.0000 | ORAL_TABLET | Freq: Once | ORAL | Status: AC
  Administered 2020-11-26: 1 via ORAL
  Filled 2020-11-26: qty 1
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
## 2020-11-26 NOTE — ED Notes (Signed)
Waiting results  Eating dinner tray  [facility] deputies at bedside

## 2020-11-26 NOTE — Discharge Instructions (Signed)
Patient has a solitary rib fracture to the left ribs, chin laceration, elbow laceration.  Areas have been cleansed, wound care instructions have been discussed with the patient.  Soap and water for cleaning of the chin laceration only.  Patient has Dermabond placed over elbow lacerations.  Patient may have Tylenol, 1000 mg every 6 hours as needed for pain.  Patient may also have 800 mg ibuprofen every 6 hours for pain.  Remainder of work-up is reassuring with no evidence of additional traumatic injuries to the musculoskeletal structures.  Patient is HIV antibody positive.  At this time he will need to follow-up with infectious disease for further evaluation and work-up.  There are no antivirals prescribed at this time awaiting further testing.  Again patient must follow-up with infectious disease.
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
## 2020-11-27 LAB — HCV INTERPRETATION

## 2020-11-27 LAB — HIV-1/2 AB - DIFFERENTIATION
HIV 1 Ab: REACTIVE
HIV 2 Ab: NONREACTIVE

## 2020-11-27 LAB — HCV AB W REFLEX TO QUANT PCR: HCV Ab: <0.1 s/co ratio (ref 0.0–0.9)

## 2020-11-28 ENCOUNTER — Telehealth: Payer: Self-pay | Admitting: *Deleted

## 2020-11-28 NOTE — Telephone Encounter (Signed)
-----   Message from Odette Fraction, MD sent at 11/27/2020  5:19 PM EDT ----- Regarding: New HIV diagnosis Seems like a Newly diagnosed patient at [facility], not sure anyone has discussed this with him. He will need a follow up with [place] or at [facility] for ART initiation.

## 2020-11-28 NOTE — Telephone Encounter (Signed)
From chart, looks like the patient is an inmate in custody with [facility] sheriff's office. Patient and Field seismologist were notified by ED provider of the positive antibody.  As he is in custody, RN will send notification to [facility]. [facility] does not have a contact at [facility] department - they will have to reach out to [place]. Will let DIS know this information. Andree Coss, RN

## 2020-11-28 NOTE — Telephone Encounter (Signed)
Sounds good. Thank you

## 2020-11-28 NOTE — Telephone Encounter (Signed)
Per Barbara Cower at [facility], patient was notified of his positive HIV test 05/29/2020 in [facility]. Andree Coss, RN
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
